# Patient Record
Sex: Male | Born: 2005 | Race: Black or African American | Hispanic: No | Marital: Single | Smoking: Never smoker
Health system: Southern US, Community
[De-identification: ages and names within clinical notes are randomized; demographics above are authoritative.]

## PROBLEM LIST (undated history)

## (undated) DIAGNOSIS — J45909 Unspecified asthma, uncomplicated: Secondary | ICD-10-CM

## (undated) DIAGNOSIS — J302 Other seasonal allergic rhinitis: Secondary | ICD-10-CM

## (undated) DIAGNOSIS — H1010 Acute atopic conjunctivitis, unspecified eye: Secondary | ICD-10-CM

## (undated) DIAGNOSIS — Z91018 Allergy to other foods: Secondary | ICD-10-CM

## (undated) DIAGNOSIS — J40 Bronchitis, not specified as acute or chronic: Secondary | ICD-10-CM

## (undated) DIAGNOSIS — L309 Dermatitis, unspecified: Secondary | ICD-10-CM

## (undated) HISTORY — DX: Dermatitis, unspecified: L30.9

## (undated) HISTORY — PX: TONSILLECTOMY: SUR1361

## (undated) HISTORY — PX: TESTICLE SURGERY: SHX794

## (undated) HISTORY — PX: ADENOIDECTOMY: SUR15

## (undated) HISTORY — DX: Unspecified asthma, uncomplicated: J45.909

## (undated) HISTORY — DX: Other seasonal allergic rhinitis: J30.2

---
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
## 2024-04-03 ENCOUNTER — Ambulatory Visit: Payer: 59 | Admitting: Allergy & Immunology

## 2024-04-03 ENCOUNTER — Encounter: Payer: Self-pay | Admitting: Allergy & Immunology

## 2024-04-03 VITALS — BP 114/86 | HR 103 | Temp 97.8°F | Ht 66.54 in | Wt 140.5 lb

## 2024-04-03 DIAGNOSIS — L2089 Other atopic dermatitis: Secondary | ICD-10-CM | POA: Diagnosis not present

## 2024-04-03 DIAGNOSIS — J302 Other seasonal allergic rhinitis: Secondary | ICD-10-CM | POA: Diagnosis not present

## 2024-04-03 DIAGNOSIS — J3089 Other allergic rhinitis: Secondary | ICD-10-CM | POA: Diagnosis not present

## 2024-04-03 DIAGNOSIS — J4530 Mild persistent asthma, uncomplicated: Secondary | ICD-10-CM | POA: Diagnosis not present

## 2024-04-03 DIAGNOSIS — E739 Lactose intolerance, unspecified: Secondary | ICD-10-CM

## 2024-04-03 DIAGNOSIS — H1013 Acute atopic conjunctivitis, bilateral: Secondary | ICD-10-CM

## 2024-04-03 DIAGNOSIS — H1010 Acute atopic conjunctivitis, unspecified eye: Secondary | ICD-10-CM

## 2024-04-03 DIAGNOSIS — T7800XD Anaphylactic reaction due to unspecified food, subsequent encounter: Secondary | ICD-10-CM

## 2024-04-03 MED ORDER — LEVOCETIRIZINE DIHYDROCHLORIDE 5 MG PO TABS: 5.0000 mg | ORAL_TABLET | Freq: Every evening | ORAL | 4 refills | Status: DC

## 2024-04-03 MED ORDER — EPINEPHRINE 0.3 MG/0.3ML IJ SOAJ: 0.3000 mg | INTRAMUSCULAR | 2 refills | Status: AC | PRN

## 2024-04-03 NOTE — Progress Notes (Signed)
 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 Mild persistent asthma without complication - Lung testing looks great today.  - We are not going to make any changes today.  - Daily controller medication(s): NOTHING - Prior to physical activity: AirSupra or albuterol  2 puffs 10-15 minutes before physical activity. - Rescue medications: AirSupra or albuterol  4 puffs every 4-6 hours as needed - Changes during respiratory infections or worsening symptoms: ADD ON Flovent  to 4 puffs twice daily for TWO WEEKS. -We will check spirometry testing at your next office visit - Asthma control goals:  * Full participation in all desired activities (may need albuterol  before activity) * Albuterol  use two time or less a week on average (not counting use with activity) * Cough interfering with sleep two time or less a month * Oral steroids no more than once a year * No hospitalizations  2. Seasonal and perennial allergic rhinoconjunctivitis (grasses, ragweed, trees, indoor molds, outdoor molds, dust mites and cockroach) - Continue taking: Flonase  (fluticasone ) one spray per nostril daily AS NEEDED (this will help with the postnasal drip and throat clearing) and Xyzal  5mg  daily - You can use an extra dose of the antihistamine, if needed, for breakthrough symptoms.  - You seem to have a good handle on your symptoms   3. Flexural atopic dermatitis - Skin looks great.  - Continue with moisturizing twice  daily.   4. 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  - EpiPen  reordered - Consider OIT for cashew/pistachio.   5. Return in about 6 months (around 10/04/2024). You can have the follow up appointment with Dr. Idolina Barry or a Nurse Practicioner (our Nurse Practitioners are excellent and always have Physician oversight!).   Subjective:   Ryan Barry is a 18 y.o. male presenting today for follow up of  Chief Complaint  Patient presents with   Follow-up    Ryan Barry has a history of the following: Patient Active Problem List   Diagnosis Date Noted   Anaphylactic shock due to adverse food reaction 06/07/2023   Mild persistent asthma without complication 09/28/2020   Osgood-Schlatter's disease of left lower extremity 09/28/2019   Seasonal and perennial allergic rhinoconjunctivitis    Atopic dermatitis, unspecified 08/14/2019   Other allergic rhinitis 08/14/2019   Mild intermittent asthma, uncomplicated 08/14/2019   Mild intermittent asthma with (acute) exacerbation 08/14/2019    History obtained from: chart review and patient.  Discussed the use of AI scribe software for clinical note transcription with the patient and/or guardian, who gave verbal consent to proceed.  Ryan Barry is a 18 y.o. male presenting for a follow up visit.  He was last seen in July 2024 by Ryan Barry one of the nurse practitioners.  At that time, he continued with Airsupra as needed.  For his rhinitis, we continued Flonase  as well as Xyzal .  Atopic dermatitis was under good control with moisturizing.  We did talk about doing OIT for cashew or pistachio.  EpiPen  was refilled.  Since last visit, he has done very well.   Asthma/Respiratory Symptom History: His asthma is well-controlled with no recent exacerbations or hospital visits for breathing issues. 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 No nocturnal cough is reported. Ryan Barry's asthma has been well controlled. He has not required rescue medication, experienced nocturnal awakenings due to lower respiratory symptoms, nor have activities of daily living been limited. He has required no Emergency Department or Urgent Care visits for his asthma. He has required zero courses of systemic steroids for asthma exacerbations since the last visit. ACT score today is 25, indicating excellent asthma symptom control.   Allergic Rhinitis Symptom History: His environmental allergies are stable with medication, although he experienced a flare-up for one week, which has since resolved. No recent sinus infections are reported.  He has been doing very well with his environmental allergies.  He is not using the nose spray much at all.  He will use Xyzal , although this is not daily.  Food Allergy Symptom History: He experiences bloating and diarrhea with excessive consumption of dairy products like milk or yogurt, suggesting a possible intolerance. Symptoms only occur with excessive intake, and he is uncertain if this is lactose intolerance.  He continues to avoid pistachios and cashews.  He has not needed to use his EpiPen .  He is graduating soon and plans to attend [employer] to study Psychologist, counselling. He is considering joining the marching band. He is an only child, and his family, including his dog, will miss him when he moves for college.   Otherwise, there have been no changes to his past medical history, surgical history, family history, or social history.    Review of systems otherwise negative other than that mentioned in the HPI.    Objective:   Blood pressure 114/86, pulse (!) 103, temperature 97.8 F (36.6 C), height 5' 6.54" (1.69 m), weight 140 lb 8 oz (63.7 kg), SpO2 96%. Body mass index is 22.31 kg/m.    Physical Exam Vitals reviewed.  Constitutional:      Appearance: Normal appearance. He  is well-developed.     Comments: Very courteous young man.  Talkative. Smiling.   HENT:     Head: Normocephalic and atraumatic.     Right Ear: Tympanic membrane, ear canal and external ear normal.     Left Ear: Tympanic membrane, ear canal and external ear normal.     Nose: No nasal deformity, septal deviation, mucosal edema or rhinorrhea.     Right Turbinates: Swollen and pale.     Left Turbinates: Swollen and pale.     Right Sinus: No maxillary sinus tenderness or frontal sinus tenderness.     Left Sinus: No maxillary sinus tenderness or frontal sinus tenderness.     Comments: No nasal polyps noted.     Mouth/Throat:     Lips: Pink.     Mouth: Mucous membranes are moist. Mucous membranes are not pale and not dry.     Pharynx: Uvula midline.     Comments: Cobblestoning present in the posterior oropharynx.  Eyes:     General: Lids are normal. Allergic shiner present.        Right eye: No discharge.        Left eye: No discharge.     Conjunctiva/sclera: Conjunctivae normal.     Right eye: Right conjunctiva is not injected. No chemosis.  Left eye: Left conjunctiva is not injected. No chemosis.    Pupils: Pupils are equal, round, and reactive to light.  Cardiovascular:     Rate and Rhythm: Normal rate and regular rhythm.     Heart sounds: Normal heart sounds.  Pulmonary:     Effort: Pulmonary effort is normal. No tachypnea, accessory muscle usage or respiratory distress.     Breath sounds: Normal breath sounds. No wheezing, rhonchi or rales.     Comments: Moving air well in all young fields. No wheezing. Chest:     Chest wall: No tenderness.  Lymphadenopathy:     Cervical: No cervical adenopathy.  Skin:    General: Skin is warm.     Capillary Refill: Capillary refill takes less than 2 seconds.     Coloration: Skin is not pale.     Findings: No abrasion, erythema, petechiae or rash. Rash is not papular, urticarial or vesicular.  Neurological:     Mental Status: He is alert.   Psychiatric:        Behavior: Behavior is cooperative.      Diagnostic studies:    Spirometry: results normal (FEV1: 2.86/83%, FVC: 4.31/109%, FEV1/FVC: 66%).    Spirometry consistent with normal pattern.   Allergy Studies: none       Drexel Gentles, MD  [facility] 

## 2024-04-03 NOTE — Patient Instructions (Addendum)
 1. Mild persistent asthma without complication - Lung testing looks great today.  - We are not going to make any changes today.  - Daily controller medication(s): NOTHING - Prior to physical activity: AirSupra or albuterol  2 puffs 10-15 minutes before physical activity. - Rescue medications: AirSupra or albuterol  4 puffs every 4-6 hours as needed - Changes during respiratory infections or worsening symptoms: ADD ON Flovent  44mcg to 4 puffs twice daily for TWO WEEKS. -We will check spirometry testing at your next office visit - Asthma control goals:  * Full participation in all desired activities (may need albuterol  before activity) * Albuterol  use two time or less a week on average (not counting use with activity) * Cough interfering with sleep two time or less a month * Oral steroids no more than once a year * No hospitalizations  2. Seasonal and perennial allergic rhinoconjunctivitis (grasses, ragweed, trees, indoor molds, outdoor molds, dust mites and cockroach) - Continue taking: Flonase  (fluticasone ) one spray per nostril daily AS NEEDED (this will help with the postnasal drip and throat clearing) and Xyzal  5mg  daily - You can use an extra dose of the antihistamine, if needed, for breakthrough symptoms.  - You seem to have a good handle on your symptoms   3. Flexural atopic dermatitis - Skin looks great.  - Continue with moisturizing twice daily.   4. 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  - EpiPen  reordered - Consider OIT for cashew/pistachio.   5. Return in about 6 months (around 10/04/2024). You can have the follow up appointment with Dr. Idolina Maker or a Nurse Practicioner (our Nurse Practitioners are excellent and always have Physician oversight!).    Please inform us  of any Emergency Department visits, hospitalizations, or changes in symptoms. Call us  before going to the ED for breathing or allergy symptoms since we  might be able to fit you in for a sick visit. Feel free to contact us  anytime with any questions, problems, or concerns.  It was a pleasure to see you again today!  Websites that have reliable patient information: 1. American Academy of Asthma, Allergy, and Immunology: www.aaaai.org 2. Food Allergy Research and Education (FARE): foodallergy.org 3. Mothers of Asthmatics: http://www.asthmacommunitynetwork.org 4. American College of Allergy, Asthma, and Immunology: www.acaai.org      "Like" us  on Facebook and Instagram for our latest updates!      A healthy democracy works best when [employer] participate! Make sure you are registered to vote! If you have moved or changed any of your contact information, you will need to get this updated before voting! Scan the QR codes below to learn more!

## 2024-10-14 ENCOUNTER — Ambulatory Visit: Admitting: Allergy & Immunology

## 2024-10-28 ENCOUNTER — Other Ambulatory Visit: Payer: Self-pay

## 2024-10-28 ENCOUNTER — Ambulatory Visit: Admitting: Allergy & Immunology

## 2024-10-28 ENCOUNTER — Encounter: Payer: Self-pay | Admitting: Allergy & Immunology

## 2024-10-28 VITALS — BP 98/66 | HR 62 | Temp 98.3°F | Ht 66.93 in | Wt 143.8 lb

## 2024-10-28 DIAGNOSIS — L2089 Other atopic dermatitis: Secondary | ICD-10-CM

## 2024-10-28 DIAGNOSIS — T7800XD Anaphylactic reaction due to unspecified food, subsequent encounter: Secondary | ICD-10-CM | POA: Diagnosis not present

## 2024-10-28 DIAGNOSIS — H1010 Acute atopic conjunctivitis, unspecified eye: Secondary | ICD-10-CM

## 2024-10-28 DIAGNOSIS — J302 Other seasonal allergic rhinitis: Secondary | ICD-10-CM

## 2024-10-28 DIAGNOSIS — J3089 Other allergic rhinitis: Secondary | ICD-10-CM | POA: Diagnosis not present

## 2024-10-28 DIAGNOSIS — J4530 Mild persistent asthma, uncomplicated: Secondary | ICD-10-CM | POA: Diagnosis not present

## 2024-10-28 NOTE — Progress Notes (Unsigned)
 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   Subjective:   Ryan Barry is a 18 y.o. male presenting today for follow up of  Chief Complaint  Patient presents with   Seasonal and perennial allergic rhinoconjunctivitis   Asthma   Follow-up    He doing well. No issues    Ryan Barry has a history of the following: Patient Active Problem List   Diagnosis Date Noted   Anaphylactic shock due to adverse food reaction 06/07/2023   Mild persistent asthma without complication 09/28/2020   Osgood-Schlatter's disease of left lower extremity 09/28/2019   Seasonal and perennial allergic rhinoconjunctivitis    Atopic dermatitis, unspecified 08/14/2019   Other allergic rhinitis 08/14/2019   Mild intermittent asthma, uncomplicated 08/14/2019   Mild intermittent asthma with (acute) exacerbation 08/14/2019    History obtained from: chart review and patient.  Discussed the use of AI scribe software for clinical note transcription with the patient and/or guardian, who gave verbal consent to proceed.  Ryan Barry is a 18 y.o. male presenting for a follow up visit.  He was last seen in May 2025.  At that time, lung testing looked great.  We continue with a rescue medication as needed with Flovent  added during flares for his asthma.  For his rhinitis, we continue with Flonase  as needed and Xyzal  5 mg daily.  His skin looked great.  For  his food allergies, he continue to avoid pistachio and cashew.  We talked about OIT for long-term control.  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 He is considering joining the marching band. He is an only child, and his family, including his dog, will miss him when he moves for college.     Otherwise, there have been no changes to his past medical history, surgical history, family history, or social history.    Review of systems otherwise negative other than that mentioned in the HPI.    Objective:   Blood pressure 98/66, pulse 62, temperature 98.3 F (36.8 C), temperature source Temporal, height 5' 6.93 (1.7 m), weight 143 lb 12.8 oz (65.2 kg), SpO2 98%. Body mass index is 22.57 kg/m.    Physical Exam Vitals reviewed.  Constitutional:      Appearance: Normal appearance. He is well-developed.     Comments: Very courteous young man.  Talkative. Smiling.   HENT:     Head: Normocephalic and atraumatic.     Right Ear: Tympanic membrane, ear canal and external ear normal.     Left Ear: Tympanic membrane, ear canal and external ear normal.     Nose: No nasal deformity, septal deviation, mucosal edema or rhinorrhea.     Right Turbinates: Enlarged, swollen and pale.  Left Turbinates: Enlarged, swollen and pale.     Right Sinus: No maxillary sinus tenderness or frontal sinus tenderness.     Left Sinus: No maxillary sinus tenderness or frontal sinus tenderness.     Comments: No nasal polyps noted.     Mouth/Throat:     Lips: Pink.     Mouth: Mucous membranes are moist. Mucous membranes are not pale and not dry.     Pharynx: Uvula midline.     Comments: Cobblestoning present in the posterior  oropharynx.  Eyes:     General: Lids are normal. Allergic shiner present.        Right eye: No discharge.        Left eye: No discharge.     Conjunctiva/sclera: Conjunctivae normal.     Right eye: Right conjunctiva is not injected. No chemosis.    Left eye: Left conjunctiva is not injected. No chemosis.    Pupils: Pupils are equal, round, and reactive to light.  Cardiovascular:     Rate and Rhythm: Normal rate and regular rhythm.     Heart sounds: Normal heart sounds.  Pulmonary:     Effort: Pulmonary effort is normal. No tachypnea, accessory muscle usage or respiratory distress.     Breath sounds: Normal breath sounds. No wheezing, rhonchi or rales.     Comments: Moving air well in all young fields. No wheezing. Chest:     Chest wall: No tenderness.  Lymphadenopathy:     Cervical: No cervical adenopathy.  Skin:    General: Skin is warm.     Capillary Refill: Capillary refill takes less than 2 seconds.     Coloration: Skin is not pale.     Findings: No abrasion, erythema, petechiae or rash. Rash is not papular, urticarial or vesicular.     Comments: No eczmeatous or urticarial leisons.   Neurological:     Mental Status: He is alert.  Psychiatric:        Behavior: Behavior is cooperative.      Diagnostic studies:    Spirometry: results normal (FEV1: 3.64/105%, FVC: 4.35/109%, FEV1/FVC: 84%).    Spirometry consistent with normal pattern.   Allergy Studies: none      Marty Shaggy, MD  [facility] 

## 2024-10-28 NOTE — Patient Instructions (Addendum)
 1. Mild persistent asthma without complication - Lung testing looks great today.  - We are not going to make any changes today.  - Daily controller medication(s): NOTHING - Prior to physical activity: AirSupra or albuterol  2 puffs 10-15 minutes before physical activity. - Rescue medications: AirSupra or albuterol  4 puffs every 4-6 hours as needed - Changes during respiratory infections or worsening symptoms: ADD ON Flovent  to 4 puffs twice daily for TWO WEEKS. -We will check spirometry testing at your next office visit - Asthma control goals:  * Full participation in all desired activities (may need albuterol  before activity) * Albuterol  use two time or less a week on average (not counting use with activity) * Cough interfering with sleep two time or less a month * Oral steroids no more than once a year * No hospitalizations  2. Seasonal and perennial allergic rhinoconjunctivitis (grasses, ragweed, trees, indoor molds, outdoor molds, dust mites and cockroach) - Continue taking: Flonase  (fluticasone ) one spray per nostril daily this will help with the postnasal drip and throat clearing) and Xyzal  5mg  daily - You can use an extra dose of the antihistamine, if needed, for breakthrough symptoms.  - You seem to have a good handle on your symptoms   3. Flexural atopic dermatitis - Skin looks great.  - Continue with moisturizing twice daily.   4. 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  - Continue to avoid cashew and pistachios.  - EpiPen  reordered  5. Return in about 2 days (around 10/30/2024) for CASHEW AND PISTACHIO TESTING. You can have the follow up appointment with Dr. Iva or a Nurse Practicioner (our Nurse Practitioners are excellent and always have Physician oversight!).    Please inform us  of any Emergency Department visits, hospitalizations, or changes in symptoms. Call us  before going to the  ED for breathing or allergy symptoms since we might be able to fit you in for a sick visit. Feel free to contact us  anytime with any questions, problems, or concerns.  It was a pleasure to see you again today!  Websites that have reliable patient information: 1. American Academy of Asthma, Allergy, and Immunology: www.aaaai.org 2. Food Allergy Research and Education (FARE): foodallergy.org 3. Mothers of Asthmatics: http://www.asthmacommunitynetwork.org 4. American College of Allergy, Asthma, and Immunology: www.acaai.org      Like us  on [employer] and Instagram for our latest updates!      A healthy democracy works best when [employer] participate! Make sure you are registered to vote! If you have moved or changed any of your contact information, you will need to get this updated before voting! Scan the QR codes below to learn more!

## 2024-10-30 ENCOUNTER — Encounter: Payer: Self-pay | Admitting: Allergy & Immunology

## 2024-10-30 ENCOUNTER — Ambulatory Visit: Admitting: Allergy & Immunology

## 2024-10-30 DIAGNOSIS — Z91018 Allergy to other foods: Secondary | ICD-10-CM | POA: Diagnosis not present

## 2024-10-30 MED ORDER — LEVOCETIRIZINE DIHYDROCHLORIDE 5 MG PO TABS: 5.0000 mg | ORAL_TABLET | Freq: Every evening | ORAL | 4 refills | Status: DC

## 2024-10-30 NOTE — Patient Instructions (Addendum)
 1. 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 - So I hate to do a challenge based on the skin testing results without a good positive control. - That would set you up for a possible anaphylaxis episode. - Labs will help us  clarify the possibility of your reacting from a challenge.  - Labs ordered (we have numbing spray in the [place] office, just ask for it!)  Our [facility] phlebotomist is here during the following hours:  - Monday: 9am to 12:30pm and 1:30pm to 5pm - Tuesday: 9am to 12:30pm and 1:30pm to 5:30pm - Wednesday: 9am to 12:30pm and 1:30pm to 5pm - Thursday: 9am to 12:30pm and 1:30pm to 5:30pm - Friday: 9am - 12:30pm and 1:30pm to 4:00pm  2. Return in about 6 months (around 04/30/2025).    Please inform us  of any Emergency Department visits, hospitalizations, or changes in symptoms. Call us  before going to the ED for breathing or allergy symptoms since we might be able to fit you in for a sick visit. Feel free to contact us  anytime with any questions, problems, or concerns.  It was a pleasure to see you again today!  Websites that have reliable patient information: 1. American Academy of Asthma, Allergy, and Immunology: www.aaaai.org 2. Food Allergy Research and Education (FARE): foodallergy.org 3. Mothers of Asthmatics: http://www.asthmacommunitynetwork.org 4. American College of Allergy, Asthma, and Immunology: www.acaai.org      Like us  on [employer] and Instagram for our latest updates!      A healthy democracy works best when [employer] participate! Make sure you are registered to vote! If you have moved or changed any of your contact information, you will need to get this updated before voting! Scan the QR codes below to learn more!         Food Adult Perc - 10/30/24 0800     Time Antigen Placed 9161    Allergen Manufacturer Jestine    Location [place]    Number of allergen test 4      Control-buffer 50% Glycerol Negative    Control-Histamine --   +/-   10. Cashew --   2 x 2   15. Pistachio Negative

## 2024-10-30 NOTE — Progress Notes (Signed)
 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 - So I hate to do a challenge based on the skin testing results without a good positive control. - That would set you up for a possible anaphylaxis episode. - Labs will help us  clarify the possibility of your reacting from a challenge.  - Labs ordered (we have numbing spray in the [place] office, just ask for it!)  Our [facility] phlebotomist is here during the following hours:  - Monday: 9am to 12:30pm and 1:30pm to 5pm - Tuesday: 9am to 12:30pm and 1:30pm to 5:30pm - Wednesday: 9am to 12:30pm and 1:30pm to 5pm - Thursday: 9am to 12:30pm and 1:30pm to 5:30pm - Friday: 9am - 12:30pm and 1:30pm to 4:00pm  2. Return in about 6 months (around 04/30/2025).     Subjective:   Ryan Barry is a 18 y.o. male presenting today for follow up of No chief complaint on file.   Ryan Barry has a history of the following: Patient Active Problem List   Diagnosis Date Noted   Anaphylactic shock due to adverse food reaction 06/07/2023   Mild persistent asthma without complication 09/28/2020   Osgood-Schlatter's disease of left lower extremity 09/28/2019   Seasonal and perennial allergic rhinoconjunctivitis    Atopic dermatitis, unspecified 08/14/2019   Other allergic rhinitis 08/14/2019   Mild intermittent asthma,  uncomplicated 08/14/2019   Mild intermittent asthma with (acute) exacerbation 08/14/2019    History obtained from: chart review and patient.  Discussed the use of AI scribe software for clinical note transcription with the patient and/or guardian, who gave verbal consent to proceed.  Ryan Barry is a 18 y.o. male presenting for skin testing. He was last seen on December 17th. We could not do testing because he had not been off his antihistamines and preferred to not do blood work. He has been off of all antihistamines 3 days in anticipation of the testing.   Otherwise, there have been no changes to his past medical history, surgical history, family history, or social history.    Review of systems otherwise negative other than that mentioned in the HPI.    Objective:   There were no vitals taken for this visit. There is no height or weight on file to calculate BMI.    Physical exam deferred since this was a skin testing appointment only.   Diagnostic studies:    Allergy Studies:     Food Adult Perc - 10/30/24 0800     Time Antigen Placed 9161    Allergen Manufacturer Jestine    Location [place]    Number of allergen test 4     Control-buffer 50% Glycerol Negative    Control-Histamine --   +/-   10. Cashew --   2 x 2   15. Pistachio Negative          Allergy testing results were read and interpreted by myself, documented  by clinical staff.      Ryan Shaggy, MD  [facility]        "

## 2024-12-11 ENCOUNTER — Ambulatory Visit: Admitting: Pediatrics

## 2024-12-11 ENCOUNTER — Encounter: Payer: Self-pay | Admitting: Pediatrics

## 2024-12-11 VITALS — BP 120/70 | HR 72 | Ht 67.17 in | Wt 141.6 lb

## 2024-12-11 DIAGNOSIS — R293 Abnormal posture: Secondary | ICD-10-CM | POA: Diagnosis not present

## 2024-12-11 DIAGNOSIS — J4520 Mild intermittent asthma, uncomplicated: Secondary | ICD-10-CM

## 2024-12-11 DIAGNOSIS — Z1331 Encounter for screening for depression: Secondary | ICD-10-CM | POA: Diagnosis not present

## 2024-12-11 DIAGNOSIS — J302 Other seasonal allergic rhinitis: Secondary | ICD-10-CM

## 2024-12-11 DIAGNOSIS — Z0000 Encounter for general adult medical examination without abnormal findings: Secondary | ICD-10-CM | POA: Diagnosis not present

## 2024-12-11 DIAGNOSIS — H1010 Acute atopic conjunctivitis, unspecified eye: Secondary | ICD-10-CM | POA: Diagnosis not present

## 2024-12-11 DIAGNOSIS — J3089 Other allergic rhinitis: Secondary | ICD-10-CM | POA: Diagnosis not present

## 2024-12-11 DIAGNOSIS — S46912A Strain of unspecified muscle, fascia and tendon at shoulder and upper arm level, left arm, initial encounter: Secondary | ICD-10-CM

## 2024-12-11 MED ORDER — LEVOCETIRIZINE DIHYDROCHLORIDE 5 MG PO TABS: 5.0000 mg | ORAL_TABLET | Freq: Every evening | ORAL | 4 refills | Status: AC

## 2024-12-11 MED ORDER — ALBUTEROL SULFATE HFA 108 (90 BASE) MCG/ACT IN AERS: 2.0000 | INHALATION_SPRAY | RESPIRATORY_TRACT | 1 refills | Status: AC | PRN

## 2024-12-11 NOTE — Patient Instructions (Addendum)
 UPPER BACK AND NECK  Chin tuck Side stretch (keep your shoulders down)  Forward stretch  Arm pull Shoulder rolls   LOWER BACK  Forward bend to middle then to each side.   Twist on bed both sides    Do these stretches 2 times a day for 5 days.   Sleep on your back.  If you have to sleep on your side, you need to double up your pillow.    No lifting.   Rest your back. Elevate your book/paperwork/tablet.    If your shoulder is still popping next week, call the office and I'll refer you to the Ortho Surgeon.   ----  [facility] on [facility].  Dr Almarie Cleveland

## 2024-12-15 ENCOUNTER — Encounter: Payer: Self-pay | Admitting: Pediatrics

## 2025-05-05 ENCOUNTER — Ambulatory Visit: Admitting: Allergy & Immunology
